# Patient Record
Sex: Male | Born: 1955 | Race: White | Hispanic: No | Marital: Married | Smoking: Never smoker
Health system: Southern US, Community
[De-identification: ages and names within clinical notes are randomized; demographics above are authoritative.]

## PROBLEM LIST (undated history)

## (undated) DIAGNOSIS — M5126 Other intervertebral disc displacement, lumbar region: Secondary | ICD-10-CM

## (undated) DIAGNOSIS — M21379 Foot drop, unspecified foot: Secondary | ICD-10-CM

## (undated) DIAGNOSIS — K219 Gastro-esophageal reflux disease without esophagitis: Secondary | ICD-10-CM

## (undated) DIAGNOSIS — M549 Dorsalgia, unspecified: Secondary | ICD-10-CM

## (undated) DIAGNOSIS — M5430 Sciatica, unspecified side: Secondary | ICD-10-CM

## (undated) HISTORY — PX: APPENDECTOMY: SHX54

---

## 2009-03-18 ENCOUNTER — Ambulatory Visit: Payer: Self-pay | Admitting: Interventional Radiology

## 2009-03-18 ENCOUNTER — Emergency Department (HOSPITAL_BASED_OUTPATIENT_CLINIC_OR_DEPARTMENT_OTHER): Admission: EM | Admit: 2009-03-18 | Discharge: 2009-03-18 | Payer: Self-pay | Admitting: Emergency Medicine

## 2013-04-29 ENCOUNTER — Encounter (HOSPITAL_BASED_OUTPATIENT_CLINIC_OR_DEPARTMENT_OTHER): Payer: Self-pay | Admitting: Emergency Medicine

## 2013-04-29 ENCOUNTER — Emergency Department (HOSPITAL_BASED_OUTPATIENT_CLINIC_OR_DEPARTMENT_OTHER): Payer: PRIVATE HEALTH INSURANCE

## 2013-04-29 ENCOUNTER — Emergency Department (HOSPITAL_BASED_OUTPATIENT_CLINIC_OR_DEPARTMENT_OTHER)
Admission: EM | Admit: 2013-04-29 | Discharge: 2013-04-29 | Disposition: A | Discharge status: Home / Self Care | Payer: PRIVATE HEALTH INSURANCE | Attending: Emergency Medicine | Admitting: Emergency Medicine

## 2013-04-29 DIAGNOSIS — K219 Gastro-esophageal reflux disease without esophagitis: Secondary | ICD-10-CM | POA: Insufficient documentation

## 2013-04-29 DIAGNOSIS — Z79899 Other long term (current) drug therapy: Secondary | ICD-10-CM | POA: Insufficient documentation

## 2013-04-29 DIAGNOSIS — N23 Unspecified renal colic: Secondary | ICD-10-CM | POA: Insufficient documentation

## 2013-04-29 DIAGNOSIS — Z880 Allergy status to penicillin: Secondary | ICD-10-CM | POA: Insufficient documentation

## 2013-04-29 HISTORY — DX: Dorsalgia, unspecified: M54.9

## 2013-04-29 HISTORY — DX: Gastro-esophageal reflux disease without esophagitis: K21.9

## 2013-04-29 LAB — URINALYSIS, ROUTINE W REFLEX MICROSCOPIC
Bilirubin Urine: NEGATIVE
GLUCOSE, UA: NEGATIVE mg/dL
KETONES UR: NEGATIVE mg/dL
NITRITE: NEGATIVE
PH: 7 (ref 5.0–8.0)
PROTEIN: NEGATIVE mg/dL
SPECIFIC GRAVITY, URINE: 1.022 (ref 1.005–1.030)
Urobilinogen, UA: 0.2 mg/dL (ref 0.0–1.0)

## 2013-04-29 LAB — URINE MICROSCOPIC-ADD ON

## 2013-04-29 MED ORDER — TAMSULOSIN HCL 0.4 MG PO CAPS
0.4000 mg | ORAL_CAPSULE | Freq: Once | ORAL | Status: AC
  Administered 2013-04-29: 0.4 mg via ORAL

## 2013-04-29 MED ORDER — SODIUM CHLORIDE 0.9 % IV SOLN
INTRAVENOUS | Status: DC
  Administered 2013-04-29: 01:00:00 via INTRAVENOUS

## 2013-04-29 MED ORDER — HYDROMORPHONE HCL PF 1 MG/ML IJ SOLN
1.0000 mg | Freq: Once | INTRAMUSCULAR | Status: AC
  Administered 2013-04-29: 1 mg via INTRAVENOUS
  Filled 2013-04-29: qty 1

## 2013-04-29 MED ORDER — TAMSULOSIN HCL 0.4 MG PO CAPS: ORAL_CAPSULE | ORAL | Status: AC

## 2013-04-29 MED ORDER — NAPROXEN SODIUM 220 MG PO TABS: ORAL_TABLET | ORAL | Status: AC

## 2013-04-29 MED ORDER — KETOROLAC TROMETHAMINE 30 MG/ML IJ SOLN
30.0000 mg | Freq: Once | INTRAMUSCULAR | Status: AC
  Administered 2013-04-29: 30 mg via INTRAVENOUS
  Filled 2013-04-29: qty 1

## 2013-04-29 MED ORDER — HYDROMORPHONE HCL 2 MG PO TABS: 2.0000 mg | ORAL_TABLET | ORAL | Status: AC | PRN

## 2013-04-29 MED ORDER — ONDANSETRON 8 MG PO TBDP: 8.0000 mg | ORAL_TABLET | Freq: Three times a day (TID) | ORAL | Status: AC | PRN

## 2013-04-29 MED ORDER — TAMSULOSIN HCL 0.4 MG PO CAPS
ORAL_CAPSULE | ORAL | Status: AC
  Administered 2013-04-29: 0.4 mg via ORAL
  Filled 2013-04-29: qty 1

## 2013-04-29 MED ORDER — ONDANSETRON HCL 4 MG/2ML IJ SOLN
4.0000 mg | Freq: Once | INTRAMUSCULAR | Status: AC
  Administered 2013-04-29: 4 mg via INTRAVENOUS
  Filled 2013-04-29: qty 2

## 2013-04-29 NOTE — Discharge Instructions (Signed)
Kidney Stones  Kidney stones (urolithiasis) are deposits that form inside your kidneys. The intense pain is caused by the Scotto moving through the urinary tract. When the Aiello moves, the ureter goes into spasm around the Garro. The Emami is usually passed in the urine.   CAUSES   · A disorder that makes certain neck glands produce too much parathyroid hormone (primary hyperparathyroidism).  · A buildup of uric acid crystals, similar to gout in your joints.  · Narrowing (stricture) of the ureter.  · A kidney obstruction present at birth (congenital obstruction).  · Previous surgery on the kidney or ureters.  · Numerous kidney infections.  SYMPTOMS   · Feeling sick to your stomach (nauseous).  · Throwing up (vomiting).  · Blood in the urine (hematuria).  · Pain that usually spreads (radiates) to the groin.  · Frequency or urgency of urination.  DIAGNOSIS   · Taking a history and physical exam.  · Blood or urine tests.  · CT scan.  · Occasionally, an examination of the inside of the urinary bladder (cystoscopy) is performed.  TREATMENT   · Observation.  · Increasing your fluid intake.  · Extracorporeal shock wave lithotripsy This is a noninvasive procedure that uses shock waves to break up kidney stones.  · Surgery may be needed if you have severe pain or persistent obstruction. There are various surgical procedures. Most of the procedures are performed with the use of small instruments. Only small incisions are needed to accommodate these instruments, so recovery time is minimized.  The size, location, and chemical composition are all important variables that will determine the proper choice of action for you. Talk to your health care provider to better understand your situation so that you will minimize the risk of injury to yourself and your kidney.   HOME CARE INSTRUCTIONS   · Drink enough water and fluids to keep your urine clear or pale yellow. This will help you to pass the Mitchelle or Dossett fragments.  · Strain  all urine through the provided strainer. Keep all particulate matter and stones for your health care provider to see. The Lemus causing the pain may be as small as a grain of salt. It is very important to use the strainer each and every time you pass your urine. The collection of your Greb will allow your health care provider to analyze it and verify that a Quint has actually passed. The Manzano analysis will often identify what you can do to reduce the incidence of recurrences.  · Only take over-the-counter or prescription medicines for pain, discomfort, or fever as directed by your health care provider.  · Make a follow-up appointment with your health care provider as directed.  · Get follow-up X-rays if required. The absence of pain does not always mean that the Hoston has passed. It may have only stopped moving. If the urine remains completely obstructed, it can cause loss of kidney function or even complete destruction of the kidney. It is your responsibility to make sure X-rays and follow-ups are completed. Ultrasounds of the kidney can show blockages and the status of the kidney. Ultrasounds are not associated with any radiation and can be performed easily in a matter of minutes.  SEEK MEDICAL CARE IF:  · You experience pain that is progressive and unresponsive to any pain medicine you have been prescribed.  SEEK IMMEDIATE MEDICAL CARE IF:   · Pain cannot be controlled with the prescribed medicine.  · You have a fever   or shaking chills.  · The severity or intensity of pain increases over 18 hours and is not relieved by pain medicine.  · You develop a new onset of abdominal pain.  · You feel faint or pass out.  · You are unable to urinate.  MAKE SURE YOU:   · Understand these instructions.  · Will watch your condition.  · Will get help right away if you are not doing well or get worse.  Document Released: 01/24/2005 Document Revised: 09/26/2012 Document Reviewed: 06/27/2012  [employer]® Patient Information ©2014  [employer].

## 2013-04-29 NOTE — ED Provider Notes (Signed)
CSN: [id number]     Arrival date & time 04/29/13  0053 History   First MD Initiated Contact with Patient 04/29/13 0105     Chief Complaint  Patient presents with  . Flank Pain     (Consider location/radiation/quality/duration/timing/severity/associated sxs/prior Treatment) HPI This is a 58 year old male who started having pain in his left flank up at 10:30 yesterday evening. He has never had similar pain and has no history of kidney stones. He describes the pain as initially feeling like bad gas pains. The pain is now radiating to his left groin. The pain is moderate to severe. It does not change with movement or palpation. He has been nauseated and has vomited one time. He has not noticed any hematuria but has had difficulty urinating, voiding only small amounts at a time.  Past Medical History  Diagnosis Date  . Back pain   . Acid reflux    Past Surgical History  Procedure Laterality Date  . Appendectomy     No family history on file. History  Substance Use Topics  . Smoking status: Never Smoker   . Smokeless tobacco: Not on file  . Alcohol Use: No    Review of Systems  All other systems reviewed and are negative.   Allergies  Penicillins  Home Medications   Current Outpatient Rx  Name  Route  Sig  Dispense  Refill  . esomeprazole (NEXIUM) 40 MG capsule   Oral   Take 40 mg by mouth daily at 12 noon.          BP 149/75  Pulse 58  Temp(Src) 97.8 F (36.6 C) (Oral)  Resp 18  Ht 6' (1.829 m)  Wt 220 lb (99.791 kg)  BMI 29.83 kg/m2  SpO2 100%  Physical Exam General: Well-developed, well-nourished male in no acute distress; appearance consistent with age of record; appears uncomfortable HENT: normocephalic; atraumatic Eyes: pupils equal, round and reactive to light; extraocular muscles intact Neck: supple Heart: regular rate and rhythm; no murmurs, rubs or gallops Lungs: clear to auscultation bilaterally Abdomen: soft; nondistended; nontender; no masses or  hepatosplenomegaly; bowel sounds present GU: no CVA tenderness Extremities: No deformity; full range of motion; pulses normal Neurologic: Awake, alert and oriented; motor function intact in all extremities and symmetric; no facial droop Skin: Warm and dry Psychiatric: Flat affect    ED Course  Procedures (including critical care time)  MDM   Nursing notes and vitals signs, including pulse oximetry, reviewed.  Summary of this visit's results, reviewed by myself:  Labs:  Results for orders placed during the hospital encounter of 04/29/13 (from the past 24 hour(s))  URINALYSIS, ROUTINE W REFLEX MICROSCOPIC     Status: Abnormal   Collection Time    04/29/13  1:40 AM      Result Value Ref Range   Color, Urine YELLOW  YELLOW   APPearance CLEAR  CLEAR   Specific Gravity, Urine 1.022  1.005 - 1.030   pH 7.0  5.0 - 8.0   Glucose, UA NEGATIVE  NEGATIVE mg/dL   Hgb urine dipstick SMALL (*) NEGATIVE   Bilirubin Urine NEGATIVE  NEGATIVE   Ketones, ur NEGATIVE  NEGATIVE mg/dL   Protein, ur NEGATIVE  NEGATIVE mg/dL   Urobilinogen, UA 0.2  0.0 - 1.0 mg/dL   Nitrite NEGATIVE  NEGATIVE   Leukocytes, UA TRACE (*) NEGATIVE  URINE MICROSCOPIC-ADD ON     Status: None   Collection Time    04/29/13  1:40 AM  Result Value Ref Range   Squamous Epithelial / LPF RARE  RARE   WBC, UA 0-2  <3 WBC/hpf   RBC / HPF 7-10  <3 RBC/hpf   Urine-Other MUCOUS PRESENT      Imaging Studies: Ct Abdomen Pelvis Wo Contrast  04/29/2013   CLINICAL DATA:  Left-sided flank pain radiating into the left lower abdomen.  EXAM: CT ABDOMEN AND PELVIS WITHOUT CONTRAST  TECHNIQUE: Multidetector CT imaging of the abdomen and pelvis was performed following the standard protocol without intravenous contrast.  COMPARISON:  No priors.  FINDINGS: Lung Bases: Small hiatal hernia.  Otherwise, unremarkable.  Abdomen/Pelvis: 2 mm nonobstructive calculus at the left ureterovesicular junction (image 84 of series 2) with mild  proximal hydroureteronephrosis and extensive left-sided perinephric stranding. No additional calculi are identified within the collecting system of either kidney, along the course of the right ureter, or within the lumen of the urinary bladder. The unenhanced appearance of the kidneys is otherwise unremarkable bilaterally.  The unenhanced appearance of the liver, gallbladder, pancreas, spleen and bilateral adrenal glands is unremarkable. No significant volume of ascites. No pneumoperitoneum. No pathologic distention of small bowel. No definite lymphadenopathy identified within the abdomen or pelvis on today's non contrast CT examination. Atherosclerosis in the abdominal vasculature, without evidence of aneurysm. Prostate gland and urinary bladder are unremarkable in appearance.  Musculoskeletal: There are no aggressive appearing lytic or blastic lesions noted in the visualized portions of the skeleton.  IMPRESSION: 1. 2 mm partially obstructive calculus at the left ureterovesicular junction with mild proximal hydroureteronephrosis and extensive perinephric stranding. 2. Small hiatal hernia.   Electronically Signed   By: Trudie Reedaniel  Entrikin M.D.   On: 04/29/2013 01:45   2:14 AM Pain and nausea well controlled with IV medications. Patient advised of lab and CT findings.    Hanley SeamenJohn L Koben Daman, MD 04/29/13 [phone]

## 2013-04-29 NOTE — ED Notes (Signed)
Onset of left flank pain that has radiated into his left lower abdomen three hours ago.  Pt is pale, in moderate discomfort.

## 2013-04-29 NOTE — ED Notes (Signed)
Pt currently in CT.

## 2013-06-12 ENCOUNTER — Emergency Department (HOSPITAL_BASED_OUTPATIENT_CLINIC_OR_DEPARTMENT_OTHER)
Admission: EM | Admit: 2013-06-12 | Discharge: 2013-06-12 | Disposition: A | Discharge status: Home / Self Care | Payer: PRIVATE HEALTH INSURANCE | Attending: Emergency Medicine | Admitting: Emergency Medicine

## 2013-06-12 ENCOUNTER — Encounter (HOSPITAL_BASED_OUTPATIENT_CLINIC_OR_DEPARTMENT_OTHER): Payer: Self-pay | Admitting: Emergency Medicine

## 2013-06-12 DIAGNOSIS — M792 Neuralgia and neuritis, unspecified: Secondary | ICD-10-CM

## 2013-06-12 DIAGNOSIS — Z79899 Other long term (current) drug therapy: Secondary | ICD-10-CM | POA: Insufficient documentation

## 2013-06-12 DIAGNOSIS — Z880 Allergy status to penicillin: Secondary | ICD-10-CM | POA: Insufficient documentation

## 2013-06-12 DIAGNOSIS — R5383 Other fatigue: Secondary | ICD-10-CM

## 2013-06-12 DIAGNOSIS — IMO0002 Reserved for concepts with insufficient information to code with codable children: Secondary | ICD-10-CM | POA: Insufficient documentation

## 2013-06-12 DIAGNOSIS — R5381 Other malaise: Secondary | ICD-10-CM | POA: Insufficient documentation

## 2013-06-12 DIAGNOSIS — K219 Gastro-esophageal reflux disease without esophagitis: Secondary | ICD-10-CM | POA: Insufficient documentation

## 2013-06-12 HISTORY — DX: Foot drop, unspecified foot: M21.379

## 2013-06-12 HISTORY — DX: Sciatica, unspecified side: M54.30

## 2013-06-12 HISTORY — DX: Other intervertebral disc displacement, lumbar region: M51.26

## 2013-06-12 LAB — BASIC METABOLIC PANEL
BUN: 25 mg/dL — ABNORMAL HIGH (ref 6–23)
CALCIUM: 9.7 mg/dL (ref 8.4–10.5)
CO2: 27 meq/L (ref 19–32)
Chloride: 100 mEq/L (ref 96–112)
Creatinine, Ser: 1.1 mg/dL (ref 0.50–1.35)
GFR calc Af Amer: 84 mL/min — ABNORMAL LOW (ref >90)
GFR calc non Af Amer: 72 mL/min — ABNORMAL LOW (ref >90)
Glucose, Bld: 126 mg/dL — ABNORMAL HIGH (ref 70–99)
POTASSIUM: 4.5 meq/L (ref 3.7–5.3)
SODIUM: 141 meq/L (ref 137–147)

## 2013-06-12 MED ORDER — GABAPENTIN 300 MG PO CAPS: 300.0000 mg | ORAL_CAPSULE | Freq: Three times a day (TID) | ORAL | Status: AC

## 2013-06-12 MED ORDER — PREDNISONE 50 MG PO TABS: 50.0000 mg | ORAL_TABLET | Freq: Every day | ORAL | Status: AC

## 2013-06-12 NOTE — ED Notes (Signed)
"  I'm sick and tired of my arms and hands,my right side and my left foot being numb."  Numbness x5 weeks. [facility]

## 2013-06-12 NOTE — Discharge Instructions (Signed)

## 2013-06-12 NOTE — ED Provider Notes (Signed)
CSN: [id number]     Arrival date & time 06/12/13  1742 History   First MD Initiated Contact with Patient 06/12/13 1755     Chief Complaint  Patient presents with  . Numbness    HPI Pt has been having numbness and weakness on both arms and his right leg.  Ongoing for 5 weeks.  Preceding that he was having some neck pain but none now.  The burning is worse today .  [facility]  He was given ibuprofen.  He then saw another doctor and was started on steroids. The symptoms unfortunately persist.  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  He has an appointment with Dr Newell CoralNudelman on May 19th.  The symptoms were worse today.  It is causing him to have trouble sleeping.     Past Medical History  Diagnosis Date  . Back pain   . Acid reflux   . Sciatica   . Ruptured lumbar disc   . Foot drop    Past Surgical History  Procedure Laterality Date  . Appendectomy     No family history on file. History  Substance Use Topics  . Smoking status: Never Smoker   . Smokeless tobacco: Never Used  . Alcohol Use: No    Review of Systems  Constitutional: Negative for fever.  Neurological: Negative for seizures and headaches.  All other systems reviewed and are negative.     Allergies  Penicillins  Home Medications   Prior to Admission medications   Medication Sig Start Date End Date Taking? Authorizing Provider  esomeprazole (NEXIUM) 40 MG capsule Take 40 mg by mouth daily at 12 noon.    Historical Provider, MD  HYDROmorphone (DILAUDID) 2 MG tablet Take 1 tablet (2 mg total) by mouth every 4 (four) hours as needed (for pain). 04/29/13   Carlisle BeersJohn L Molpus, MD  naproxen sodium (ALEVE) 220 MG tablet Take 2 tablets every 12 hours until Baxley passes. This taken with a meal. 04/29/13   Carlisle BeersJohn L Molpus, MD  ondansetron (ZOFRAN ODT) 8 MG disintegrating tablet Take 1 tablet (8 mg total) by mouth every 8 (eight) hours as needed. 04/29/13   Carlisle BeersJohn L Molpus, MD   tamsulosin (FLOMAX) 0.4 MG CAPS capsule Take 1 capsule daily until C[place]hill passes. 04/29/13   John L Molpus, MD   BP 175/99  Pulse 73  Temp(Src) 98.3 F (36.8 C) (Oral)  Resp 16  Ht 6' (1.829 m)  Wt 223 lb (101.152 kg)  BMI 30.24 kg/m2  SpO2 100% Physical Exam  Nursing note and vitals reviewed. Constitutional: He appears well-developed and well-nourished. No distress.  HENT:  Head: Normocephalic and atraumatic.  Right Ear: External ear normal.  Left Ear: External ear normal.  Eyes: Conjunctivae are normal. Right eye exhibits no discharge. Left eye exhibits no discharge. No scleral icterus.  Neck: Neck supple. No tracheal deviation present.  Cardiovascular: Normal rate, regular rhythm and intact distal pulses.   Pulmonary/Chest: Effort normal and breath sounds normal. No stridor. No respiratory distress. He has no wheezes. He has no rales.  Abdominal: Soft. Bowel sounds are normal. He exhibits no distension. There is no tenderness. There is no rebound and no guarding.  Musculoskeletal: He exhibits no edema and no tenderness.  Neurological: He is alert. He has normal strength. No cranial nerve deficit (no facial droop, extraocular movements intact, no slurred speech) or sensory deficit. He exhibits normal muscle tone. He displays no seizure  activity. Coordination normal.  Reflex Scores:      Bicep reflexes are 2+ on the right side and 2+ on the left side.      Patellar reflexes are 2+ on the right side and 2+ on the left side.      Achilles reflexes are 2+ on the right side and 2+ on the left side. Pt feels that left arm is numb but sensation is intact  Skin: Skin is warm and dry. No rash noted.  Psychiatric: He has a normal mood and affect.    ED Course  Procedures (including critical care time) Labs Review Labs Reviewed  BASIC METABOLIC PANEL - Abnormal; Notable for the following:    Glucose, Bld 126 (*)    BUN 25 (*)    GFR calc non Af Amer 72 (*)    GFR calc Af Amer 84 (*)     All other components within normal limits    Imaging Review No results found.   EKG Interpretation None      MDM   Final diagnoses:  Neuropathic pain    Possible cervical disc disease.  Symptoms suggest peripheral neuropathy.   Will try neurontin for symptomatic relief.  Pt has appointment with neurosurgery.  Slightly elevated blood sugar but doubt diabetic neuropathy.  Ultimately likely will need MRI of C spine but no emergent need at this time.    Celene KrasJon R Augustin Bun, MD 06/12/13 [phone]

## 2014-08-26 IMAGING — CT CT ABD-PELV W/O CM
2 of 4 series · 16 of 46 positions shown, 18 images · non-contrast
Comparison: No priors.

CLINICAL DATA: Left-sided flank pain radiating into the left lower
abdomen.

EXAM:
CT ABDOMEN AND PELVIS WITHOUT CONTRAST
TECHNIQUE: Multidetector CT imaging of the abdomen and pelvis was performed
following the standard protocol without intravenous contrast.

[Series 2: renal stone < 200 lbs 5.0 b31f · axial · 0.79mm/px · z∈[-534,-104]mm · 13 of 96 slices shown, 15 images]
[im 5/96  soft-tissue]
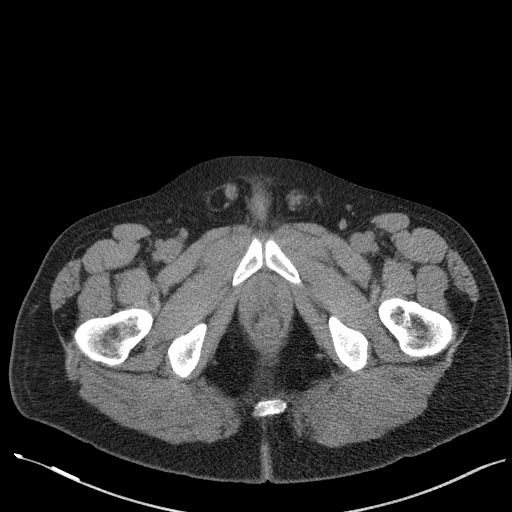
[im 5/96  bone]
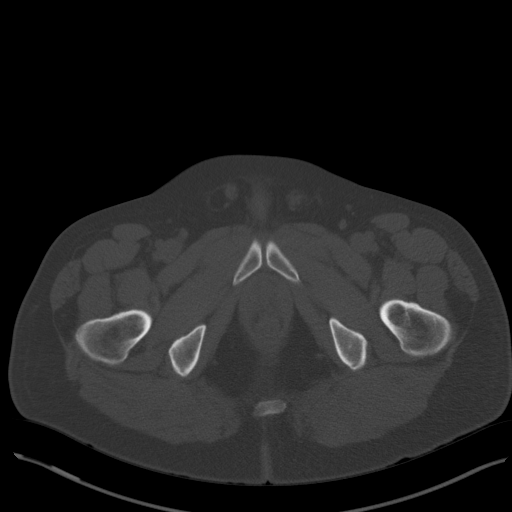
[im 13/96  soft-tissue]
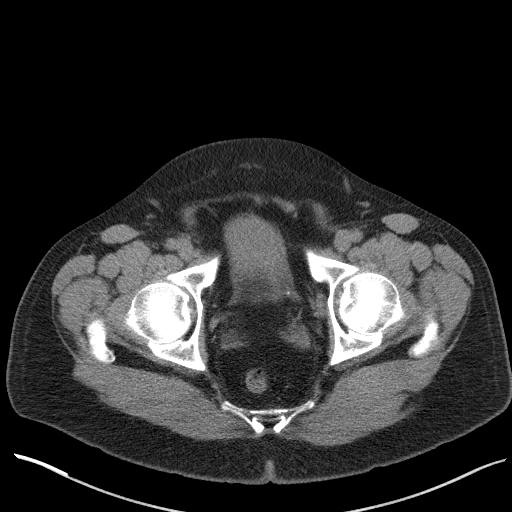
[im 21/96  soft-tissue]
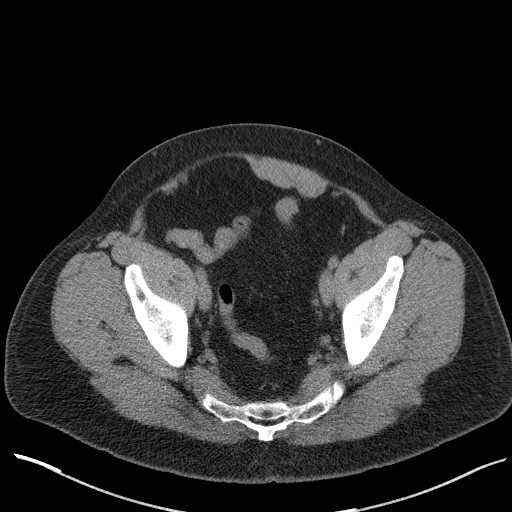
[im 25/96  soft-tissue]
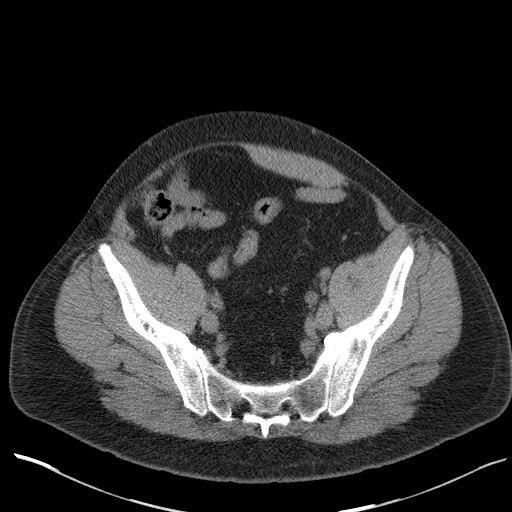
[im 34/96  soft-tissue]
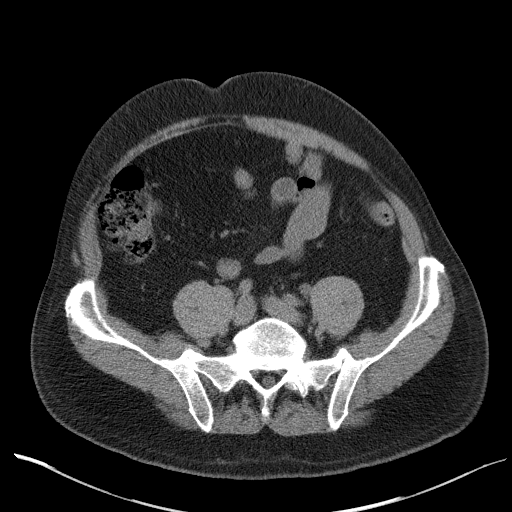
[im 42/96  soft-tissue]
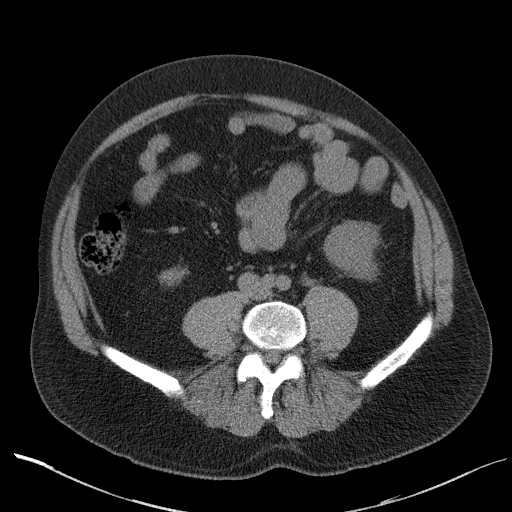
[im 50/96  soft-tissue]
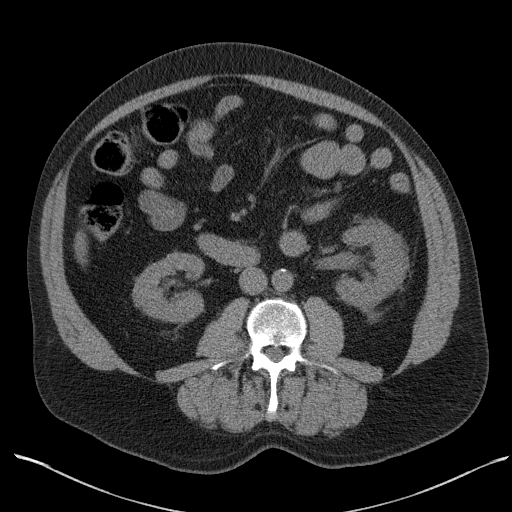
[im 54/96  soft-tissue]
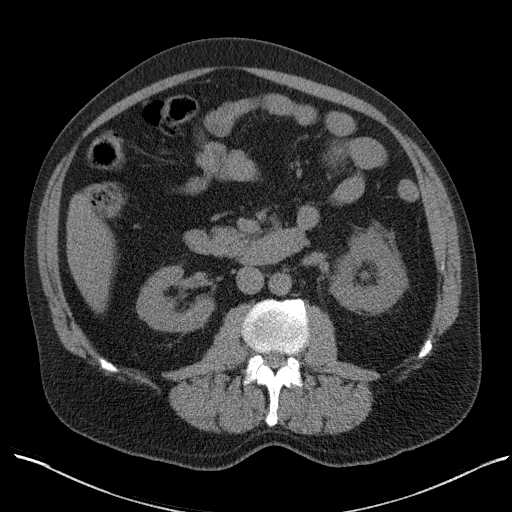
[im 62/96  soft-tissue]
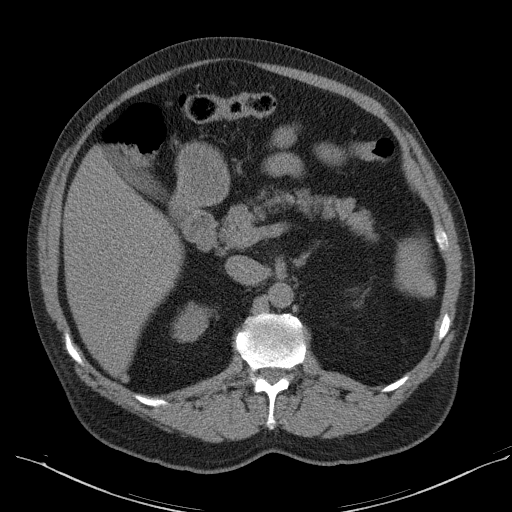
[im 62/96  bone]
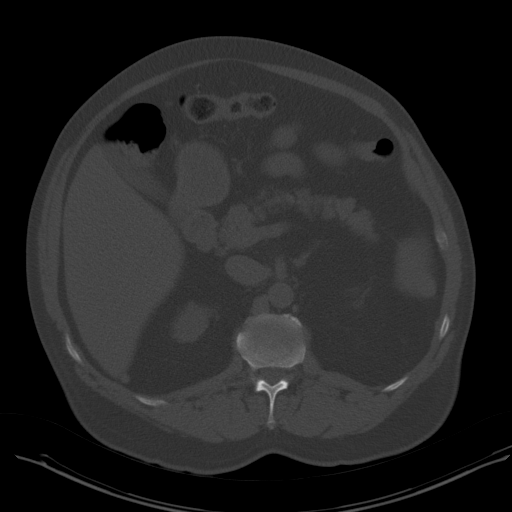
[im 71/96  soft-tissue]
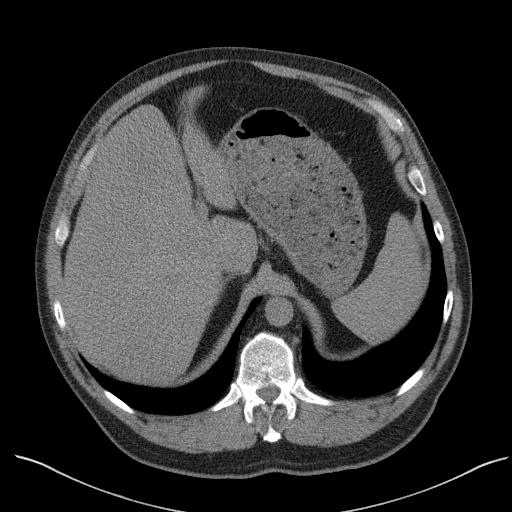
[im 75/96  soft-tissue]
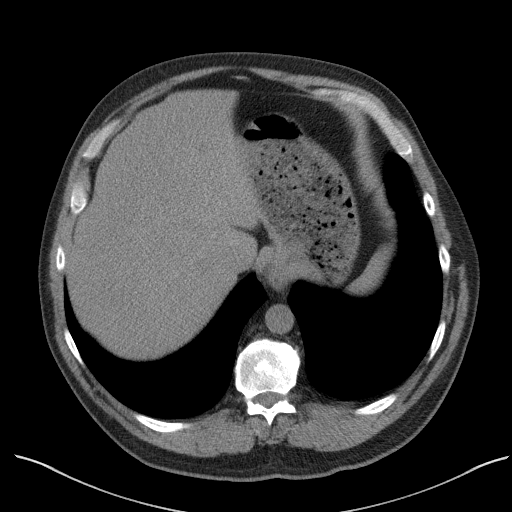
[im 83/96  soft-tissue]
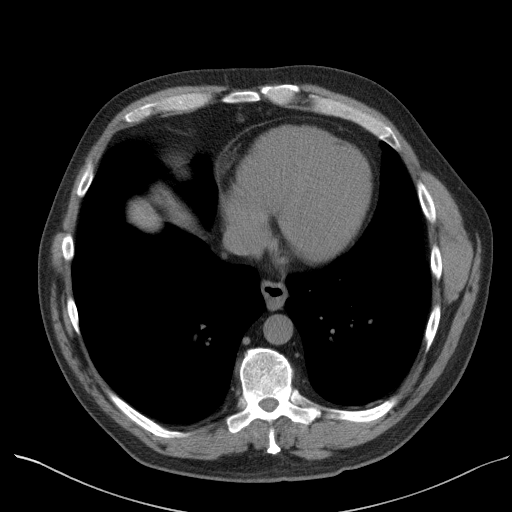
[im 91/96  soft-tissue]
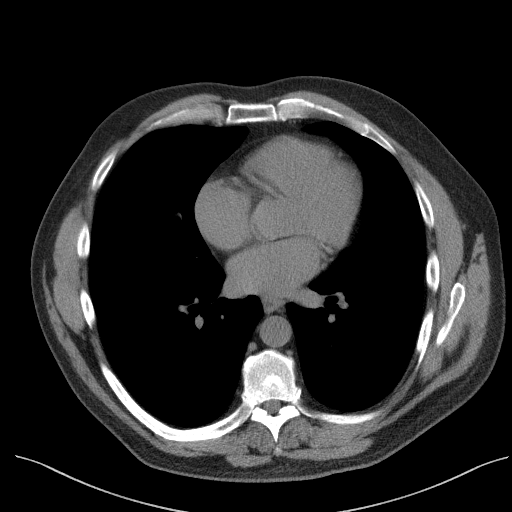

[Series 5: renal stone 3.0 coronal · coronal · 0.85mm/px · 3 of 110 slices shown]
[im 37/110  soft-tissue]
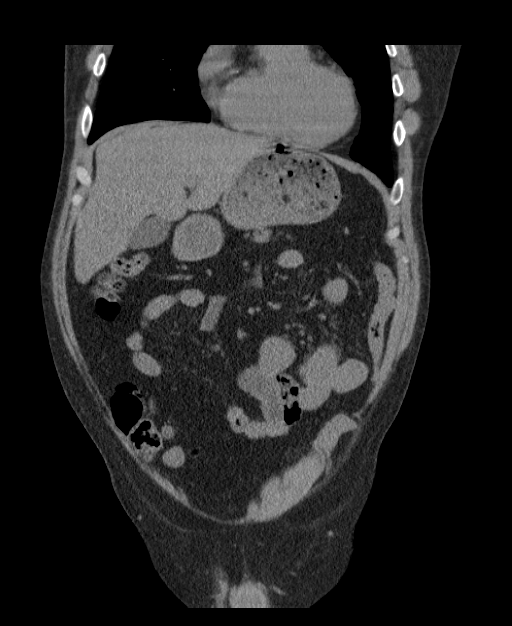
[im 49/110  soft-tissue]
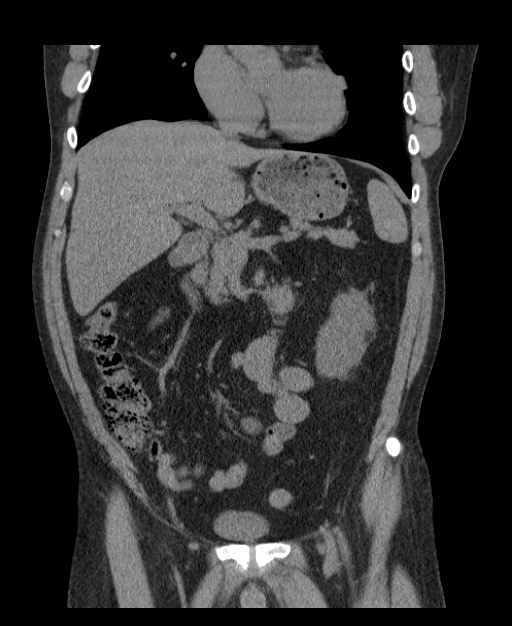
[im 61/110  soft-tissue]
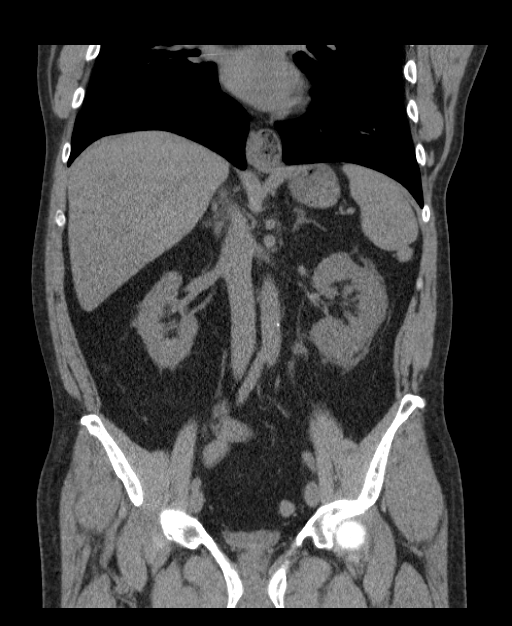

[16 of 46 positions shown; findings below may reference images not displayed]

FINDINGS: Lung Bases: Small hiatal hernia.  Otherwise, unremarkable.

Abdomen/Pelvis: 2 mm nonobstructive calculus at the left
ureterovesicular junction (image 84 of series 2) with mild proximal
hydroureteronephrosis and extensive left-sided perinephric
stranding. No additional calculi are identified within the
collecting system of either kidney, along the course of the right
ureter, or within the lumen of the urinary bladder. The unenhanced
appearance of the kidneys is otherwise unremarkable bilaterally.

The unenhanced appearance of the liver, gallbladder, pancreas,
spleen and bilateral adrenal glands is unremarkable. No significant
volume of ascites. No pneumoperitoneum. No pathologic distention of
small bowel. No definite lymphadenopathy identified within the
abdomen or pelvis on today's non contrast CT examination.
Atherosclerosis in the abdominal vasculature, without evidence of
aneurysm. Prostate gland and urinary bladder are unremarkable in
appearance.

Musculoskeletal: There are no aggressive appearing lytic or blastic
lesions noted in the visualized portions of the skeleton.
IMPRESSION: 1. 2 mm partially obstructive calculus at the left ureterovesicular
junction with mild proximal hydroureteronephrosis and extensive
perinephric stranding.
2. Small hiatal hernia.
